# Patient Record
Sex: Male | Born: 1975 | Hispanic: Yes | Smoking: Never smoker
Health system: Southern US, Community
[De-identification: ages and names within clinical notes are randomized; demographics above are authoritative.]

## PROBLEM LIST (undated history)

## (undated) DIAGNOSIS — N412 Abscess of prostate: Secondary | ICD-10-CM

## (undated) DIAGNOSIS — W3400XA Accidental discharge from unspecified firearms or gun, initial encounter: Secondary | ICD-10-CM

## (undated) HISTORY — PX: FOOT SURGERY: SHX648

## (undated) SURGICAL SUPPLY — 4 items
BAG DRN RND TRDRP ANRFLXCHMBR (UROLOGICAL SUPPLIES) ×1
CATH FOLEY 2WAY SLVR  5CC 16FR (CATHETERS)
GOWN STRL REUS W/TWL XL LVL3 (GOWN DISPOSABLE) ×1
SYR TOOMEY IRRIG 70ML (MISCELLANEOUS) ×1

---

## 2019-02-11 DIAGNOSIS — F1010 Alcohol abuse, uncomplicated: Secondary | ICD-10-CM | POA: Insufficient documentation

## 2019-02-11 DIAGNOSIS — Z23 Encounter for immunization: Secondary | ICD-10-CM | POA: Insufficient documentation

## 2019-02-11 DIAGNOSIS — S0180XA Unspecified open wound of other part of head, initial encounter: Secondary | ICD-10-CM | POA: Insufficient documentation

## 2019-02-11 DIAGNOSIS — Y999 Unspecified external cause status: Secondary | ICD-10-CM | POA: Insufficient documentation

## 2019-02-11 DIAGNOSIS — Y939 Activity, unspecified: Secondary | ICD-10-CM | POA: Insufficient documentation

## 2019-02-11 DIAGNOSIS — Y92009 Unspecified place in unspecified non-institutional (private) residence as the place of occurrence of the external cause: Secondary | ICD-10-CM | POA: Insufficient documentation

## 2019-02-11 DIAGNOSIS — T148XXA Other injury of unspecified body region, initial encounter: Secondary | ICD-10-CM

## 2019-02-11 MED ORDER — SODIUM CHLORIDE 0.9 % IV BOLUS (SEPSIS)
1000.0000 mL | Freq: Once | INTRAVENOUS | Status: AC
  Administered 2019-02-11: 05:00:00 1000 mL via INTRAVENOUS

## 2019-02-11 MED ORDER — TETANUS-DIPHTH-ACELL PERTUSSIS 5-2.5-18.5 LF-MCG/0.5 IM SUSP
0.5000 mL | Freq: Once | INTRAMUSCULAR | Status: AC
  Administered 2019-02-11: 0.5 mL via INTRAMUSCULAR

## 2019-02-11 MED ORDER — KETOROLAC TROMETHAMINE 60 MG/2ML IM SOLN
30.0000 mg | Freq: Once | INTRAMUSCULAR | Status: AC
  Administered 2019-02-11: 30 mg via INTRAMUSCULAR

## 2020-11-07 DIAGNOSIS — T7840XA Allergy, unspecified, initial encounter: Secondary | ICD-10-CM | POA: Insufficient documentation

## 2020-11-07 DIAGNOSIS — F10929 Alcohol use, unspecified with intoxication, unspecified: Secondary | ICD-10-CM

## 2020-11-07 DIAGNOSIS — F10129 Alcohol abuse with intoxication, unspecified: Secondary | ICD-10-CM | POA: Insufficient documentation

## 2020-11-07 DIAGNOSIS — Y906 Blood alcohol level of 120-199 mg/100 ml: Secondary | ICD-10-CM | POA: Insufficient documentation

## 2020-11-07 MED ORDER — METHYLPREDNISOLONE SODIUM SUCC 125 MG IJ SOLR
125.0000 mg | Freq: Once | INTRAMUSCULAR | Status: AC
  Administered 2020-11-07: 125 mg via INTRAVENOUS

## 2020-11-08 MED ORDER — DIPHENHYDRAMINE HCL 50 MG/ML IJ SOLN
50.0000 mg | Freq: Once | INTRAMUSCULAR | Status: AC
  Administered 2020-11-08: 50 mg via INTRAVENOUS

## 2020-11-08 MED ORDER — PREDNISONE 20 MG PO TABS: 40.0000 mg | ORAL_TABLET | Freq: Every day | ORAL | 0 refills | Status: AC

## 2021-03-02 DIAGNOSIS — S2231XA Fracture of one rib, right side, initial encounter for closed fracture: Secondary | ICD-10-CM | POA: Insufficient documentation

## 2021-03-02 DIAGNOSIS — S40012A Contusion of left shoulder, initial encounter: Secondary | ICD-10-CM | POA: Insufficient documentation

## 2021-03-02 DIAGNOSIS — W19XXXA Unspecified fall, initial encounter: Secondary | ICD-10-CM

## 2021-03-02 DIAGNOSIS — R202 Paresthesia of skin: Secondary | ICD-10-CM | POA: Insufficient documentation

## 2021-03-02 DIAGNOSIS — W14XXXA Fall from tree, initial encounter: Secondary | ICD-10-CM | POA: Insufficient documentation

## 2021-03-02 DIAGNOSIS — R1012 Left upper quadrant pain: Secondary | ICD-10-CM | POA: Insufficient documentation

## 2021-03-02 DIAGNOSIS — Z20822 Contact with and (suspected) exposure to covid-19: Secondary | ICD-10-CM | POA: Insufficient documentation

## 2021-03-02 DIAGNOSIS — Y908 Blood alcohol level of 240 mg/100 ml or more: Secondary | ICD-10-CM | POA: Insufficient documentation

## 2021-03-02 DIAGNOSIS — Y990 Civilian activity done for income or pay: Secondary | ICD-10-CM | POA: Insufficient documentation

## 2021-03-02 DIAGNOSIS — F10129 Alcohol abuse with intoxication, unspecified: Secondary | ICD-10-CM | POA: Insufficient documentation

## 2021-03-02 DIAGNOSIS — Y93H2 Activity, gardening and landscaping: Secondary | ICD-10-CM | POA: Insufficient documentation

## 2021-03-02 DIAGNOSIS — R109 Unspecified abdominal pain: Secondary | ICD-10-CM

## 2021-03-02 DIAGNOSIS — S27321A Contusion of lung, unilateral, initial encounter: Secondary | ICD-10-CM | POA: Insufficient documentation

## 2021-03-02 DIAGNOSIS — F10920 Alcohol use, unspecified with intoxication, uncomplicated: Secondary | ICD-10-CM

## 2021-03-03 MED ORDER — IOHEXOL 350 MG/ML SOLN
100.0000 mL | Freq: Once | INTRAVENOUS | Status: AC | PRN
  Administered 2021-03-03: 100 mL via INTRAVENOUS

## 2021-04-17 DIAGNOSIS — R1013 Epigastric pain: Secondary | ICD-10-CM | POA: Insufficient documentation

## 2021-04-17 DIAGNOSIS — T7840XA Allergy, unspecified, initial encounter: Secondary | ICD-10-CM | POA: Insufficient documentation

## 2021-04-17 MED ADMIN — Methylprednisolone Sod Succ For Inj 125 MG (Base Equiv): 125 mg | INTRAVENOUS | NDC 00009004725

## 2021-04-17 MED ADMIN — Famotidine in NaCl 0.9% IV Soln 20 MG/50ML: 20 mg | INTRAVENOUS | NDC 00338519741

## 2021-10-25 DIAGNOSIS — Y908 Blood alcohol level of 240 mg/100 ml or more: Secondary | ICD-10-CM | POA: Insufficient documentation

## 2021-10-25 DIAGNOSIS — R4182 Altered mental status, unspecified: Secondary | ICD-10-CM | POA: Insufficient documentation

## 2021-10-25 DIAGNOSIS — F1910 Other psychoactive substance abuse, uncomplicated: Secondary | ICD-10-CM | POA: Insufficient documentation

## 2021-10-25 DIAGNOSIS — F10921 Alcohol use, unspecified with intoxication delirium: Secondary | ICD-10-CM | POA: Insufficient documentation

## 2021-10-25 MED ORDER — LACTATED RINGERS IV BOLUS
1000.0000 mL | Freq: Once | INTRAVENOUS | Status: AC
  Administered 2021-10-25: 1000 mL via INTRAVENOUS

## 2022-02-20 DIAGNOSIS — H6691 Otitis media, unspecified, right ear: Secondary | ICD-10-CM | POA: Insufficient documentation

## 2022-02-20 DIAGNOSIS — H6690 Otitis media, unspecified, unspecified ear: Secondary | ICD-10-CM

## 2022-02-20 MED ORDER — AMOXICILLIN 500 MG PO CAPS: 500.0000 mg | ORAL_CAPSULE | Freq: Two times a day (BID) | ORAL | 0 refills | Status: AC

## 2022-02-25 DIAGNOSIS — J069 Acute upper respiratory infection, unspecified: Secondary | ICD-10-CM | POA: Insufficient documentation

## 2022-02-25 DIAGNOSIS — Z20822 Contact with and (suspected) exposure to covid-19: Secondary | ICD-10-CM | POA: Insufficient documentation

## 2022-02-25 DIAGNOSIS — R519 Headache, unspecified: Secondary | ICD-10-CM | POA: Insufficient documentation

## 2022-02-25 DIAGNOSIS — R7401 Elevation of levels of liver transaminase levels: Secondary | ICD-10-CM | POA: Insufficient documentation

## 2022-02-25 MED ORDER — ACETAMINOPHEN 325 MG PO TABS
650.0000 mg | ORAL_TABLET | Freq: Once | ORAL | Status: AC
  Administered 2022-02-25: 650 mg via ORAL

## 2022-02-26 MED ORDER — NAPROXEN 375 MG PO TABS: 375.0000 mg | ORAL_TABLET | Freq: Two times a day (BID) | ORAL | 0 refills | Status: DC

## 2022-02-26 MED ORDER — KETOROLAC TROMETHAMINE 15 MG/ML IJ SOLN
15.0000 mg | Freq: Once | INTRAMUSCULAR | Status: AC
  Administered 2022-02-26: 15 mg via INTRAMUSCULAR

## 2022-02-26 MED ORDER — CEPACOL 15-2.3 MG MT LOZG: 1.0000 | LOZENGE | Freq: Four times a day (QID) | OROMUCOSAL | 0 refills | Status: DC

## 2022-03-13 DIAGNOSIS — Z9079 Acquired absence of other genital organ(s): Secondary | ICD-10-CM

## 2022-03-13 DIAGNOSIS — N412 Abscess of prostate: Principal | ICD-10-CM | POA: Diagnosis present

## 2022-03-13 HISTORY — DX: Accidental discharge from unspecified firearms or gun, initial encounter: W34.00XA

## 2022-03-13 MED ORDER — IOHEXOL 350 MG/ML SOLN
75.0000 mL | Freq: Once | INTRAVENOUS | Status: AC | PRN
  Administered 2022-03-13: 75 mL via INTRAVENOUS

## 2022-03-13 MED ORDER — ACETAMINOPHEN 325 MG PO TABS
650.0000 mg | ORAL_TABLET | Freq: Once | ORAL | Status: AC
  Administered 2022-03-13: 650 mg via ORAL

## 2022-03-14 DIAGNOSIS — N412 Abscess of prostate: Secondary | ICD-10-CM | POA: Diagnosis present

## 2022-03-14 HISTORY — PX: TRANSURETHRAL RESECTION OF PROSTATE: SHX73

## 2022-03-14 HISTORY — PX: CYSTOSCOPY: SHX5120

## 2022-03-14 MED ORDER — AMISULPRIDE (ANTIEMETIC) 5 MG/2ML IV SOLN: 10.0000 mg | Freq: Once | INTRAVENOUS | Status: DC | PRN

## 2022-03-14 MED ORDER — HYDROCODONE-ACETAMINOPHEN 5-325 MG PO TABS
1.0000 | ORAL_TABLET | ORAL | Status: DC | PRN
  Administered 2022-03-15 – 2022-03-17 (×7): 2 via ORAL

## 2022-03-14 MED ORDER — PROPOFOL 10 MG/ML IV BOLUS: INTRAVENOUS | Status: AC

## 2022-03-14 MED ORDER — ORAL CARE MOUTH RINSE: 15.0000 mL | Freq: Once | OROMUCOSAL | Status: AC

## 2022-03-14 MED ORDER — SODIUM CHLORIDE 0.9 % IV SOLN
2.0000 g | Freq: Every day | INTRAVENOUS | Status: DC
  Administered 2022-03-14: 2 g via INTRAVENOUS

## 2022-03-14 MED ORDER — ZOLPIDEM TARTRATE 5 MG PO TABS: 5.0000 mg | ORAL_TABLET | Freq: Every evening | ORAL | Status: DC | PRN

## 2022-03-14 MED ORDER — ONDANSETRON HCL 4 MG/2ML IJ SOLN: INTRAMUSCULAR | Status: AC

## 2022-03-14 MED ORDER — OXYBUTYNIN CHLORIDE 5 MG PO TABS
5.0000 mg | ORAL_TABLET | Freq: Three times a day (TID) | ORAL | Status: DC | PRN
  Administered 2022-03-15 – 2022-03-16 (×3): 5 mg via ORAL

## 2022-03-14 MED ORDER — DEXAMETHASONE SODIUM PHOSPHATE 10 MG/ML IJ SOLN: INTRAMUSCULAR | Status: AC

## 2022-03-14 MED ORDER — FENTANYL CITRATE (PF) 250 MCG/5ML IJ SOLN
INTRAMUSCULAR | Status: DC | PRN
  Administered 2022-03-14: 50 ug via INTRAVENOUS
  Administered 2022-03-14 (×2): 25 ug via INTRAVENOUS
  Administered 2022-03-14: 50 ug via INTRAVENOUS
  Administered 2022-03-14: 25 ug via INTRAVENOUS

## 2022-03-14 MED ORDER — PROPOFOL 10 MG/ML IV BOLUS
INTRAVENOUS | Status: DC | PRN
  Administered 2022-03-14: 140 mg via INTRAVENOUS

## 2022-03-14 MED ORDER — SODIUM CHLORIDE 0.9 % IR SOLN
3000.0000 mL | Status: DC
  Administered 2022-03-14 – 2022-03-15 (×4): 3000 mL

## 2022-03-14 MED ORDER — MIDAZOLAM HCL 2 MG/2ML IJ SOLN: INTRAMUSCULAR | Status: AC

## 2022-03-14 MED ORDER — ACETAMINOPHEN 325 MG PO TABS
650.0000 mg | ORAL_TABLET | ORAL | Status: DC | PRN
  Administered 2022-03-14 (×2): 650 mg via ORAL

## 2022-03-14 MED ORDER — FENTANYL CITRATE (PF) 100 MCG/2ML IJ SOLN
25.0000 ug | INTRAMUSCULAR | Status: DC | PRN
  Administered 2022-03-14 (×2): 25 ug via INTRAVENOUS
  Administered 2022-03-14: 50 ug via INTRAVENOUS

## 2022-03-14 MED ORDER — CHLORHEXIDINE GLUCONATE 0.12 % MT SOLN
15.0000 mL | Freq: Once | OROMUCOSAL | Status: AC
  Administered 2022-03-14: 15 mL via OROMUCOSAL

## 2022-03-14 MED ORDER — FENTANYL CITRATE (PF) 250 MCG/5ML IJ SOLN: INTRAMUSCULAR | Status: AC

## 2022-03-14 MED ORDER — ONDANSETRON HCL 4 MG/2ML IJ SOLN
INTRAMUSCULAR | Status: DC | PRN
  Administered 2022-03-14: 4 mg via INTRAVENOUS

## 2022-03-14 MED ORDER — SODIUM CHLORIDE 0.9 % IV SOLN: INTRAVENOUS | Status: DC

## 2022-03-14 MED ORDER — DEXAMETHASONE SODIUM PHOSPHATE 10 MG/ML IJ SOLN
INTRAMUSCULAR | Status: DC | PRN
  Administered 2022-03-14: 4 mg via INTRAVENOUS

## 2022-03-14 MED ORDER — FENTANYL CITRATE (PF) 100 MCG/2ML IJ SOLN: INTRAMUSCULAR | Status: AC

## 2022-03-14 MED ORDER — PHENYLEPHRINE 80 MCG/ML (10ML) SYRINGE FOR IV PUSH (FOR BLOOD PRESSURE SUPPORT)
PREFILLED_SYRINGE | INTRAVENOUS | Status: DC | PRN
  Administered 2022-03-14 (×6): 80 ug via INTRAVENOUS

## 2022-03-14 MED ORDER — HYDROMORPHONE HCL 1 MG/ML IJ SOLN: 0.5000 mg | INTRAMUSCULAR | Status: DC | PRN

## 2022-03-14 MED ORDER — LORAZEPAM 2 MG/ML IJ SOLN: 0.0000 mg | Freq: Four times a day (QID) | INTRAMUSCULAR | Status: DC

## 2022-03-14 MED ORDER — OXYCODONE HCL 5 MG PO TABS: 5.0000 mg | ORAL_TABLET | Freq: Once | ORAL | Status: DC | PRN

## 2022-03-14 MED ORDER — CHLORHEXIDINE GLUCONATE CLOTH 2 % EX PADS
6.0000 | MEDICATED_PAD | Freq: Every day | CUTANEOUS | Status: DC
  Administered 2022-03-14 – 2022-03-16 (×3): 6 via TOPICAL

## 2022-03-14 MED ORDER — LORAZEPAM 2 MG/ML IJ SOLN: 1.0000 mg | INTRAMUSCULAR | Status: DC | PRN

## 2022-03-14 MED ORDER — LORAZEPAM 2 MG/ML IJ SOLN: 0.0000 mg | Freq: Two times a day (BID) | INTRAMUSCULAR | Status: DC

## 2022-03-14 MED ORDER — ONDANSETRON HCL 4 MG/2ML IJ SOLN: 4.0000 mg | INTRAMUSCULAR | Status: DC | PRN

## 2022-03-14 MED ORDER — THIAMINE HCL 100 MG/ML IJ SOLN
100.0000 mg | Freq: Every day | INTRAMUSCULAR | Status: DC
  Administered 2022-03-14: 100 mg via INTRAVENOUS

## 2022-03-14 MED ORDER — SODIUM CHLORIDE 0.9 % IV SOLN
2.0000 g | INTRAVENOUS | Status: DC
  Administered 2022-03-15 – 2022-03-17 (×3): 2 g via INTRAVENOUS

## 2022-03-14 MED ORDER — LIDOCAINE 2% (20 MG/ML) 5 ML SYRINGE
INTRAMUSCULAR | Status: DC | PRN
  Administered 2022-03-14: 100 mg via INTRAVENOUS

## 2022-03-14 MED ORDER — OXYCODONE HCL 5 MG/5ML PO SOLN: 5.0000 mg | Freq: Once | ORAL | Status: DC | PRN

## 2022-03-14 MED ORDER — ACETAMINOPHEN 325 MG PO TABS
650.0000 mg | ORAL_TABLET | ORAL | Status: DC | PRN
  Administered 2022-03-14 – 2022-03-16 (×2): 650 mg via ORAL

## 2022-03-14 MED ORDER — SODIUM CHLORIDE 0.9 % IR SOLN
Status: DC | PRN
  Administered 2022-03-14: 6000 mL
  Administered 2022-03-14 (×2): 3000 mL
  Administered 2022-03-14: 6000 mL

## 2022-03-14 MED ORDER — LORAZEPAM 1 MG PO TABS: 1.0000 mg | ORAL_TABLET | ORAL | Status: DC | PRN

## 2022-03-14 MED ORDER — LACTATED RINGERS IV SOLN: INTRAVENOUS | Status: DC

## 2022-03-14 MED ORDER — STERILE WATER FOR IRRIGATION IR SOLN
Status: DC | PRN
  Administered 2022-03-14: 1000 mL

## 2022-03-14 MED ORDER — THIAMINE MONONITRATE 100 MG PO TABS: 100.0000 mg | ORAL_TABLET | Freq: Every day | ORAL | Status: DC

## 2022-03-14 MED ORDER — MIDAZOLAM HCL 2 MG/2ML IJ SOLN
INTRAMUSCULAR | Status: DC | PRN
  Administered 2022-03-14: 2 mg via INTRAVENOUS

## 2022-03-14 MED ORDER — SODIUM CHLORIDE 0.9 % IV SOLN
1.0000 g | Freq: Once | INTRAVENOUS | Status: AC
  Administered 2022-03-14: 1 g via INTRAVENOUS

## 2022-03-16 MED ORDER — DIPHENHYDRAMINE HCL 25 MG PO CAPS
25.0000 mg | ORAL_CAPSULE | Freq: Four times a day (QID) | ORAL | Status: DC | PRN
  Administered 2022-03-16: 25 mg via ORAL

## 2022-03-16 MED ORDER — FAMOTIDINE 20 MG PO TABS: 20.0000 mg | ORAL_TABLET | Freq: Every evening | ORAL | Status: DC | PRN

## 2022-03-17 MED ORDER — TRAMADOL HCL 50 MG PO TABS: 50.0000 mg | ORAL_TABLET | Freq: Four times a day (QID) | ORAL | 0 refills | Status: DC | PRN

## 2022-03-17 MED ORDER — CIPROFLOXACIN HCL 500 MG PO TABS: 500.0000 mg | ORAL_TABLET | Freq: Two times a day (BID) | ORAL | 0 refills | Status: AC

## 2022-03-23 DIAGNOSIS — X58XXXA Exposure to other specified factors, initial encounter: Secondary | ICD-10-CM | POA: Insufficient documentation

## 2022-03-23 DIAGNOSIS — S30812A Abrasion of penis, initial encounter: Secondary | ICD-10-CM | POA: Insufficient documentation

## 2022-03-23 DIAGNOSIS — R319 Hematuria, unspecified: Secondary | ICD-10-CM | POA: Insufficient documentation

## 2022-03-23 DIAGNOSIS — R590 Localized enlarged lymph nodes: Secondary | ICD-10-CM | POA: Insufficient documentation

## 2022-03-23 HISTORY — DX: Abscess of prostate: N41.2

## 2022-03-23 MED ORDER — LIDOCAINE HCL URETHRAL/MUCOSAL 2 % EX GEL
1.0000 | Freq: Once | CUTANEOUS | Status: AC
  Administered 2022-03-23: 1 via TOPICAL

## 2022-04-12 DIAGNOSIS — N390 Urinary tract infection, site not specified: Secondary | ICD-10-CM | POA: Insufficient documentation

## 2022-04-12 MED ORDER — ONDANSETRON HCL 4 MG/2ML IJ SOLN
4.0000 mg | Freq: Once | INTRAMUSCULAR | Status: AC
  Administered 2022-04-12: 4 mg via INTRAVENOUS

## 2022-04-12 MED ORDER — SODIUM CHLORIDE 0.9 % IV BOLUS
1000.0000 mL | Freq: Once | INTRAVENOUS | Status: AC
  Administered 2022-04-12: 1000 mL via INTRAVENOUS

## 2022-04-12 MED ORDER — SODIUM CHLORIDE 0.9 % IV SOLN
1.0000 g | Freq: Once | INTRAVENOUS | Status: AC
  Administered 2022-04-12: 1 g via INTRAVENOUS

## 2022-04-12 MED ORDER — CEPHALEXIN 500 MG PO CAPS: 500.0000 mg | ORAL_CAPSULE | Freq: Four times a day (QID) | ORAL | 0 refills | Status: DC

## 2022-04-12 MED ORDER — IOHEXOL 300 MG/ML  SOLN
100.0000 mL | Freq: Once | INTRAMUSCULAR | Status: AC | PRN
  Administered 2022-04-12: 100 mL via INTRAVENOUS

## 2022-04-16 DIAGNOSIS — L509 Urticaria, unspecified: Secondary | ICD-10-CM

## 2022-04-16 DIAGNOSIS — T7840XA Allergy, unspecified, initial encounter: Secondary | ICD-10-CM

## 2022-04-16 MED ORDER — METHYLPREDNISOLONE SODIUM SUCC 125 MG IJ SOLR
125.0000 mg | Freq: Once | INTRAMUSCULAR | Status: AC
  Administered 2022-04-16: 125 mg via INTRAVENOUS

## 2022-04-16 MED ORDER — EPINEPHRINE 0.3 MG/0.3ML IJ SOAJ
0.3000 mg | Freq: Once | INTRAMUSCULAR | Status: AC
  Administered 2022-04-16: 0.3 mg via INTRAMUSCULAR

## 2022-04-16 NOTE — ED Provider Notes (Incomplete)
[facility]-EMERGENCY DEPT Provider Note   CSN: [id number] Arrival date & time: 04/16/22  2308     History {Add pertinent medical, surgical, social history, OB history to HPI:1} Chief Complaint  Patient presents with  . Allergic Reaction    Hives    Steven Galloway is a 46 y.o. male.  Pt is a 46 yo male presenting from home via EMS for allergic reaction. Pt admits to acute onset sob, tongue swelling, difficulty swallowing, and hives that started approx 30 min pta. states symptoms started shortly after visiting the supermarket at 1030 pm tonight. Denies new food or medication intake. Denies allergies to food/mediations. Denies prior hx of allergic reaction like this. Pt states he is currently taking medications for prostatitis but does not know the name.   Patient received oral benadryl in route by EMS.  Patient received epinephrine and solumedrol subcu by triage prior to my evaluation.   The history is provided by the patient. No language interpreter was used.  Allergic Reaction Presenting symptoms: rash        Home Medications Prior to Admission medications   Medication Sig Start Date End Date Taking? Authorizing Provider  Benzocaine-Menthol (CEPACOL) 15-2.3 MG LOZG Use as directed 1 lozenge in the mouth or throat every 6 (six) hours. 02/26/22   Kommor, Madison, MD  cephALEXin (KEFLEX) 500 MG capsule Take 1 capsule (500 mg total) by mouth 4 (four) times daily. 04/12/22   Wynetta Fines, MD  naproxen (NAPROSYN) 375 MG tablet Take 1 tablet (375 mg total) by mouth 2 (two) times daily. 02/26/22   Kommor, Madison, MD  traMADol (ULTRAM) 50 MG tablet Take 1 tablet (50 mg total) by mouth every 6 (six) hours as needed. 03/17/22 03/17/23  Belva Agee, MD      Allergies    Patient has no known allergies.    Review of Systems   Review of Systems  Constitutional:  Negative for chills and fever.  HENT:  Negative for ear pain and sore throat.   Eyes:   Negative for pain and visual disturbance.  Respiratory:  Positive for shortness of breath. Negative for cough.   Cardiovascular:  Negative for chest pain and palpitations.  Gastrointestinal:  Negative for abdominal pain and vomiting.  Genitourinary:  Negative for dysuria and hematuria.  Musculoskeletal:  Negative for arthralgias and back pain.  Skin:  Positive for rash. Negative for color change.  Neurological:  Negative for seizures and syncope.  All other systems reviewed and are negative.   Physical Exam Updated Vital Signs BP (!) 146/91 (BP Location: Left Arm)   Pulse 71   Temp 97.6 F (36.4 C) (Oral)   Resp 20   SpO2 100%  Physical Exam Vitals and nursing note reviewed.  Constitutional:      General: He is not in acute distress.    Appearance: He is well-developed.  HENT:     Head: Normocephalic and atraumatic.  Eyes:     Conjunctiva/sclera: Conjunctivae normal.  Cardiovascular:     Rate and Rhythm: Normal rate and regular rhythm.     Heart sounds: No murmur heard. Pulmonary:     Effort: Pulmonary effort is normal. Tachypnea present. No respiratory distress.     Breath sounds: Normal breath sounds.  Abdominal:     Palpations: Abdomen is soft.     Tenderness: There is no abdominal tenderness.  Musculoskeletal:        General: No swelling.     Cervical back: Neck  supple.  Skin:    General: Skin is warm and dry.     Capillary Refill: Capillary refill takes less than 2 seconds.  Neurological:     Mental Status: He is alert.  Psychiatric:        Mood and Affect: Mood normal.     ED Results / Procedures / Treatments   Labs (all labs ordered are listed, but only abnormal results are displayed) Labs Reviewed  CBC  BASIC METABOLIC PANEL    EKG None  Radiology No results found.  Procedures Procedures  {Document cardiac monitor, telemetry assessment procedure when appropriate:1}  Medications Ordered in ED Medications  EPINEPHrine (EPI-PEN) injection 0.3  mg (0.3 mg Intramuscular Given 04/16/22 2341)  methylPREDNISolone sodium succinate (SOLU-MEDROL) 125 mg/2 mL injection 125 mg (125 mg Intravenous Given 04/16/22 2341)    ED Course/ Medical Decision Making/ A&P                           Medical Decision Making  ***  {Document critical care time when appropriate:1} {Document review of labs and clinical decision tools ie heart score, Chads2Vasc2 etc:1}  {Document your independent review of radiology images, and any outside records:1} {Document your discussion with family members, caretakers, and with consultants:1} {Document social determinants of health affecting pt's care:1} {Document your decision making why or why not admission, treatments were needed:1} Final Clinical Impression(s) / ED Diagnoses Final diagnoses:  None    Rx / DC Orders ED Discharge Orders     None

## 2022-04-17 MED ORDER — SODIUM CHLORIDE 0.9 % IV BOLUS
1000.0000 mL | Freq: Once | INTRAVENOUS | Status: AC
  Administered 2022-04-17: 1000 mL via INTRAVENOUS

## 2022-04-17 MED ORDER — DIPHENHYDRAMINE HCL 50 MG/ML IJ SOLN
25.0000 mg | Freq: Once | INTRAMUSCULAR | Status: AC
  Administered 2022-04-17: 25 mg via INTRAVENOUS

## 2022-04-17 MED ORDER — EPINEPHRINE 0.3 MG/0.3ML IJ SOAJ: 0.3000 mg | INTRAMUSCULAR | 0 refills | Status: AC | PRN

## 2022-04-17 MED ORDER — PREDNISONE 20 MG PO TABS: 20.0000 mg | ORAL_TABLET | Freq: Every day | ORAL | 0 refills | Status: AC

## 2022-04-17 MED ORDER — DIPHENHYDRAMINE HCL 25 MG PO TABS: 25.0000 mg | ORAL_TABLET | Freq: Four times a day (QID) | ORAL | 0 refills | Status: DC

## 2022-06-06 DIAGNOSIS — T7840XA Allergy, unspecified, initial encounter: Secondary | ICD-10-CM | POA: Insufficient documentation

## 2022-06-06 DIAGNOSIS — B029 Zoster without complications: Secondary | ICD-10-CM | POA: Insufficient documentation

## 2022-06-06 MED ORDER — HYDROCODONE-ACETAMINOPHEN 5-325 MG PO TABS: 1.0000 | ORAL_TABLET | ORAL | 0 refills | Status: DC | PRN

## 2022-06-06 MED ORDER — HYDROCORTISONE 1 % EX CREA: TOPICAL_CREAM | CUTANEOUS | 0 refills | Status: DC

## 2022-06-06 MED ORDER — VALACYCLOVIR HCL 1 G PO TABS: 1000.0000 mg | ORAL_TABLET | Freq: Three times a day (TID) | ORAL | 0 refills | Status: AC

## 2022-06-17 DIAGNOSIS — L299 Pruritus, unspecified: Secondary | ICD-10-CM | POA: Insufficient documentation

## 2022-06-17 DIAGNOSIS — L259 Unspecified contact dermatitis, unspecified cause: Secondary | ICD-10-CM | POA: Insufficient documentation

## 2022-06-17 MED ORDER — IBUPROFEN 800 MG PO TABS
800.0000 mg | ORAL_TABLET | Freq: Once | ORAL | Status: AC
  Administered 2022-06-17: 800 mg via ORAL

## 2022-06-17 MED ORDER — PREDNISONE 20 MG PO TABS: 40.0000 mg | ORAL_TABLET | Freq: Every day | ORAL | 0 refills | Status: AC

## 2022-06-17 MED ORDER — FAMOTIDINE 20 MG PO TABS: 20.0000 mg | ORAL_TABLET | Freq: Two times a day (BID) | ORAL | 0 refills | Status: DC

## 2022-06-17 MED ORDER — PREDNISONE 20 MG PO TABS
60.0000 mg | ORAL_TABLET | Freq: Once | ORAL | Status: AC
  Administered 2022-06-17: 60 mg via ORAL

## 2022-06-17 MED ORDER — ACETAMINOPHEN 500 MG PO TABS
1000.0000 mg | ORAL_TABLET | Freq: Once | ORAL | Status: AC
  Administered 2022-06-17: 1000 mg via ORAL

## 2022-06-17 MED ORDER — FAMOTIDINE 20 MG PO TABS
40.0000 mg | ORAL_TABLET | Freq: Once | ORAL | Status: AC
  Administered 2022-06-17: 40 mg via ORAL

## 2022-07-08 DIAGNOSIS — Z2914 Encounter for prophylactic rabies immune globin: Secondary | ICD-10-CM | POA: Insufficient documentation

## 2022-07-08 DIAGNOSIS — S71132A Puncture wound without foreign body, left thigh, initial encounter: Secondary | ICD-10-CM | POA: Insufficient documentation

## 2022-07-08 DIAGNOSIS — Z23 Encounter for immunization: Secondary | ICD-10-CM | POA: Insufficient documentation

## 2022-07-08 DIAGNOSIS — M79631 Pain in right forearm: Secondary | ICD-10-CM | POA: Insufficient documentation

## 2022-07-08 DIAGNOSIS — T148XXA Other injury of unspecified body region, initial encounter: Secondary | ICD-10-CM

## 2022-07-08 DIAGNOSIS — M79641 Pain in right hand: Secondary | ICD-10-CM | POA: Insufficient documentation

## 2022-07-08 DIAGNOSIS — W540XXA Bitten by dog, initial encounter: Secondary | ICD-10-CM | POA: Insufficient documentation

## 2022-07-08 MED ORDER — AMOXICILLIN-POT CLAVULANATE 875-125 MG PO TABS: 1.0000 | ORAL_TABLET | Freq: Two times a day (BID) | ORAL | 0 refills | Status: DC

## 2022-07-08 MED ORDER — AMOXICILLIN-POT CLAVULANATE 875-125 MG PO TABS
1.0000 | ORAL_TABLET | Freq: Once | ORAL | Status: AC
  Administered 2022-07-08: 1 via ORAL

## 2022-07-08 MED ORDER — RABIES IMMUNE GLOBULIN 150 UNIT/ML IM INJ
1400.0000 [IU] | INJECTION | Freq: Once | INTRAMUSCULAR | Status: AC
  Administered 2022-07-08: 1425 [IU] via INTRAMUSCULAR

## 2022-07-08 MED ORDER — RABIES VACCINE, PCEC IM SUSR
1.0000 mL | Freq: Once | INTRAMUSCULAR | Status: AC
  Administered 2022-07-08: 1 mL via INTRAMUSCULAR

## 2022-07-08 MED ORDER — TETANUS-DIPHTH-ACELL PERTUSSIS 5-2.5-18.5 LF-MCG/0.5 IM SUSY
0.5000 mL | PREFILLED_SYRINGE | Freq: Once | INTRAMUSCULAR | Status: AC
  Administered 2022-07-08: 0.5 mL via INTRAMUSCULAR

## 2022-10-06 DIAGNOSIS — Z6820 Body mass index (BMI) 20.0-20.9, adult: Secondary | ICD-10-CM | POA: Diagnosis not present

## 2022-10-06 DIAGNOSIS — J9602 Acute respiratory failure with hypercapnia: Secondary | ICD-10-CM | POA: Diagnosis present

## 2022-10-06 DIAGNOSIS — S022XXA Fracture of nasal bones, initial encounter for closed fracture: Secondary | ICD-10-CM | POA: Diagnosis present

## 2022-10-06 DIAGNOSIS — E162 Hypoglycemia, unspecified: Secondary | ICD-10-CM | POA: Diagnosis present

## 2022-10-06 DIAGNOSIS — Z9079 Acquired absence of other genital organ(s): Secondary | ICD-10-CM | POA: Diagnosis not present

## 2022-10-06 DIAGNOSIS — R4182 Altered mental status, unspecified: Secondary | ICD-10-CM

## 2022-10-06 DIAGNOSIS — T50904A Poisoning by unspecified drugs, medicaments and biological substances, undetermined, initial encounter: Secondary | ICD-10-CM

## 2022-10-06 DIAGNOSIS — E876 Hypokalemia: Secondary | ICD-10-CM | POA: Diagnosis not present

## 2022-10-06 DIAGNOSIS — R0902 Hypoxemia: Secondary | ICD-10-CM

## 2022-10-06 DIAGNOSIS — T402X1A Poisoning by other opioids, accidental (unintentional), initial encounter: Secondary | ICD-10-CM | POA: Diagnosis present

## 2022-10-06 DIAGNOSIS — J9601 Acute respiratory failure with hypoxia: Secondary | ICD-10-CM | POA: Diagnosis present

## 2022-10-06 DIAGNOSIS — D649 Anemia, unspecified: Secondary | ICD-10-CM | POA: Diagnosis present

## 2022-10-06 DIAGNOSIS — S0083XA Contusion of other part of head, initial encounter: Secondary | ICD-10-CM | POA: Diagnosis present

## 2022-10-06 DIAGNOSIS — T40604A Poisoning by unspecified narcotics, undetermined, initial encounter: Secondary | ICD-10-CM

## 2022-10-06 DIAGNOSIS — E8729 Other acidosis: Secondary | ICD-10-CM | POA: Diagnosis present

## 2022-10-06 DIAGNOSIS — Z79899 Other long term (current) drug therapy: Secondary | ICD-10-CM | POA: Diagnosis not present

## 2022-10-06 DIAGNOSIS — E440 Moderate protein-calorie malnutrition: Secondary | ICD-10-CM | POA: Diagnosis not present

## 2022-10-06 DIAGNOSIS — R7401 Elevation of levels of liver transaminase levels: Secondary | ICD-10-CM | POA: Diagnosis present

## 2022-10-06 DIAGNOSIS — G928 Other toxic encephalopathy: Secondary | ICD-10-CM | POA: Diagnosis present

## 2022-10-06 DIAGNOSIS — W1830XA Fall on same level, unspecified, initial encounter: Secondary | ICD-10-CM | POA: Diagnosis present

## 2022-10-06 DIAGNOSIS — E43 Unspecified severe protein-calorie malnutrition: Secondary | ICD-10-CM | POA: Diagnosis present

## 2022-10-06 DIAGNOSIS — Z9911 Dependence on respirator [ventilator] status: Secondary | ICD-10-CM | POA: Diagnosis not present

## 2022-10-06 DIAGNOSIS — J9600 Acute respiratory failure, unspecified whether with hypoxia or hypercapnia: Secondary | ICD-10-CM

## 2022-10-06 DIAGNOSIS — G9340 Encephalopathy, unspecified: Secondary | ICD-10-CM | POA: Diagnosis present

## 2022-10-06 DIAGNOSIS — S0990XA Unspecified injury of head, initial encounter: Secondary | ICD-10-CM

## 2022-10-06 MED ORDER — SUCCINYLCHOLINE CHLORIDE 20 MG/ML IJ SOLN
INTRAMUSCULAR | Status: AC | PRN
  Administered 2022-10-06: 120 mg via INTRAVENOUS

## 2022-10-06 MED ORDER — FAMOTIDINE IN NACL 20-0.9 MG/50ML-% IV SOLN
20.0000 mg | Freq: Two times a day (BID) | INTRAVENOUS | Status: DC
  Administered 2022-10-06 – 2022-10-07 (×2): 20 mg via INTRAVENOUS

## 2022-10-06 MED ORDER — NALOXONE HCL 2 MG/2ML IJ SOSY
2.0000 mg | PREFILLED_SYRINGE | Freq: Once | INTRAMUSCULAR | Status: AC
  Administered 2022-10-06: 2 mg via INTRAVENOUS

## 2022-10-06 MED ORDER — PROPOFOL 1000 MG/100ML IV EMUL: INTRAVENOUS | Status: AC

## 2022-10-06 MED ORDER — NALOXONE HCL 4 MG/10ML IJ SOLN
0.2500 mg/h | INTRAVENOUS | Status: DC
  Administered 2022-10-06: 0.25 mg/h via INTRAVENOUS

## 2022-10-06 MED ORDER — MIDAZOLAM HCL 2 MG/2ML IJ SOLN
1.0000 mg | INTRAMUSCULAR | Status: DC | PRN
  Administered 2022-10-06 – 2022-10-07 (×9): 2 mg via INTRAVENOUS

## 2022-10-06 MED ORDER — PNEUMOCOCCAL 20-VAL CONJ VACC 0.5 ML IM SUSY: 0.5000 mL | PREFILLED_SYRINGE | INTRAMUSCULAR | Status: DC | PRN

## 2022-10-06 MED ORDER — ORAL CARE MOUTH RINSE
15.0000 mL | OROMUCOSAL | Status: DC
  Administered 2022-10-06 – 2022-10-07 (×8): 15 mL via OROMUCOSAL

## 2022-10-06 MED ORDER — IOHEXOL 350 MG/ML SOLN
75.0000 mL | Freq: Once | INTRAVENOUS | Status: AC | PRN
  Administered 2022-10-06: 75 mL via INTRAVENOUS

## 2022-10-06 MED ORDER — MUPIROCIN 2 % EX OINT
1.0000 | TOPICAL_OINTMENT | Freq: Two times a day (BID) | CUTANEOUS | Status: DC
  Administered 2022-10-06 – 2022-10-09 (×6): 1 via NASAL

## 2022-10-06 MED ORDER — LACTATED RINGERS IV SOLN: INTRAVENOUS | Status: DC

## 2022-10-06 MED ORDER — NALOXONE HCL 2 MG/2ML IJ SOSY
0.8000 mg | PREFILLED_SYRINGE | Freq: Once | INTRAMUSCULAR | Status: AC
  Administered 2022-10-06: 0.8 mg via INTRAVENOUS

## 2022-10-06 MED ORDER — ORAL CARE MOUTH RINSE: 15.0000 mL | OROMUCOSAL | Status: DC | PRN

## 2022-10-06 MED ORDER — NALOXONE HCL 0.4 MG/ML IJ SOLN
0.4000 mg | Freq: Once | INTRAMUSCULAR | Status: AC
  Administered 2022-10-06: 0.4 mg via INTRAVENOUS

## 2022-10-06 MED ORDER — HEPARIN SODIUM (PORCINE) 5000 UNIT/ML IJ SOLN
5000.0000 [IU] | Freq: Three times a day (TID) | INTRAMUSCULAR | Status: DC
  Administered 2022-10-06 – 2022-10-08 (×8): 5000 [IU] via SUBCUTANEOUS

## 2022-10-06 MED ORDER — ONDANSETRON HCL 4 MG/2ML IJ SOLN
4.0000 mg | Freq: Once | INTRAMUSCULAR | Status: AC
  Administered 2022-10-06: 4 mg via INTRAVENOUS

## 2022-10-06 MED ORDER — DEXTROSE 50 % IV SOLN: INTRAVENOUS | Status: AC

## 2022-10-06 MED ORDER — KETAMINE HCL 10 MG/ML IJ SOLN
INTRAMUSCULAR | Status: AC | PRN
  Administered 2022-10-06: 150 mg via INTRAVENOUS

## 2022-10-06 MED ORDER — DOCUSATE SODIUM 50 MG/5ML PO LIQD: 100.0000 mg | Freq: Two times a day (BID) | ORAL | Status: DC | PRN

## 2022-10-06 MED ORDER — PROPOFOL 1000 MG/100ML IV EMUL
0.0000 ug/kg/min | INTRAVENOUS | Status: DC
  Administered 2022-10-06 (×2): 50 ug/kg/min via INTRAVENOUS
  Administered 2022-10-07: 35 ug/kg/min via INTRAVENOUS
  Administered 2022-10-07: 40 ug/kg/min via INTRAVENOUS

## 2022-10-06 MED ORDER — INSULIN ASPART 100 UNIT/ML IJ SOLN
0.0000 [IU] | INTRAMUSCULAR | Status: DC
  Administered 2022-10-06: 2 [IU] via SUBCUTANEOUS
  Administered 2022-10-07 – 2022-10-08 (×2): 1 [IU] via SUBCUTANEOUS

## 2022-10-06 MED ORDER — DEXTROSE 50 % IV SOLN
12.5000 g | INTRAVENOUS | Status: AC
  Administered 2022-10-06: 12.5 g via INTRAVENOUS

## 2022-10-06 MED ORDER — FAMOTIDINE 20 MG PO TABS: 20.0000 mg | ORAL_TABLET | Freq: Two times a day (BID) | ORAL | Status: DC

## 2022-10-06 MED ORDER — POLYETHYLENE GLYCOL 3350 17 G PO PACK: 17.0000 g | PACK | Freq: Every day | ORAL | Status: DC | PRN

## 2022-10-06 MED ORDER — DEXTROSE 50 % IV SOLN
25.0000 g | INTRAVENOUS | Status: AC
  Administered 2022-10-06: 25 g via INTRAVENOUS

## 2022-10-06 MED ORDER — CHLORHEXIDINE GLUCONATE CLOTH 2 % EX PADS
6.0000 | MEDICATED_PAD | Freq: Every day | CUTANEOUS | Status: DC
  Administered 2022-10-06 – 2022-10-08 (×3): 6 via TOPICAL

## 2022-10-06 MED ORDER — KETAMINE HCL 50 MG/5ML IJ SOSY: PREFILLED_SYRINGE | INTRAMUSCULAR | Status: AC

## 2022-10-07 DIAGNOSIS — E440 Moderate protein-calorie malnutrition: Secondary | ICD-10-CM

## 2022-10-07 DIAGNOSIS — J9601 Acute respiratory failure with hypoxia: Secondary | ICD-10-CM

## 2022-10-07 DIAGNOSIS — S022XXA Fracture of nasal bones, initial encounter for closed fracture: Secondary | ICD-10-CM

## 2022-10-07 DIAGNOSIS — J9602 Acute respiratory failure with hypercapnia: Secondary | ICD-10-CM

## 2022-10-07 DIAGNOSIS — Z9911 Dependence on respirator [ventilator] status: Secondary | ICD-10-CM

## 2022-10-07 DIAGNOSIS — G9340 Encephalopathy, unspecified: Secondary | ICD-10-CM

## 2022-10-07 MED ORDER — ADULT MULTIVITAMIN W/MINERALS CH
1.0000 | ORAL_TABLET | Freq: Every day | ORAL | Status: DC
  Administered 2022-10-07 – 2022-10-09 (×3): 1

## 2022-10-07 MED ORDER — ENSURE ENLIVE PO LIQD
237.0000 mL | Freq: Three times a day (TID) | ORAL | Status: DC
  Administered 2022-10-07 – 2022-10-08 (×3): 237 mL via ORAL

## 2022-10-07 MED ORDER — THIAMINE HCL 100 MG/ML IJ SOLN
100.0000 mg | Freq: Every day | INTRAMUSCULAR | Status: DC
  Administered 2022-10-07 – 2022-10-09 (×3): 100 mg via INTRAVENOUS

## 2022-10-07 MED ORDER — POTASSIUM CHLORIDE 20 MEQ PO PACK
40.0000 meq | PACK | Freq: Once | ORAL | Status: AC
  Administered 2022-10-07: 40 meq

## 2022-10-07 MED ORDER — SODIUM CHLORIDE 0.9 % IV SOLN: INTRAVENOUS | Status: DC | PRN

## 2022-10-07 MED ORDER — ACETAMINOPHEN 325 MG PO TABS
650.0000 mg | ORAL_TABLET | ORAL | Status: DC | PRN
  Administered 2022-10-07 – 2022-10-09 (×3): 650 mg via ORAL

## 2022-10-07 MED ORDER — FOLIC ACID 1 MG PO TABS
1.0000 mg | ORAL_TABLET | Freq: Every day | ORAL | Status: DC
  Administered 2022-10-07 – 2022-10-09 (×3): 1 mg

## 2022-10-07 MED ORDER — MAGNESIUM SULFATE 2 GM/50ML IV SOLN
2.0000 g | Freq: Once | INTRAVENOUS | Status: AC
  Administered 2022-10-07: 2 g via INTRAVENOUS

## 2022-10-07 MED ORDER — DEXTROSE 10 % IV SOLN: INTRAVENOUS | Status: DC

## 2022-10-07 MED ORDER — HALOPERIDOL LACTATE 5 MG/ML IJ SOLN
5.0000 mg | Freq: Four times a day (QID) | INTRAMUSCULAR | Status: DC | PRN
  Administered 2022-10-07: 5 mg via INTRAVENOUS

## 2022-10-07 MED ORDER — DEXMEDETOMIDINE HCL IN NACL 400 MCG/100ML IV SOLN
0.0000 ug/kg/h | INTRAVENOUS | Status: DC
  Administered 2022-10-07: 1.2 ug/kg/h via INTRAVENOUS
  Administered 2022-10-07: 0.4 ug/kg/h via INTRAVENOUS

## 2022-10-08 DIAGNOSIS — E43 Unspecified severe protein-calorie malnutrition: Secondary | ICD-10-CM | POA: Insufficient documentation

## 2023-02-25 DIAGNOSIS — B349 Viral infection, unspecified: Secondary | ICD-10-CM | POA: Insufficient documentation

## 2023-02-25 DIAGNOSIS — W57XXXA Bitten or stung by nonvenomous insect and other nonvenomous arthropods, initial encounter: Secondary | ICD-10-CM | POA: Insufficient documentation

## 2023-02-25 DIAGNOSIS — S0006XA Insect bite (nonvenomous) of scalp, initial encounter: Secondary | ICD-10-CM | POA: Insufficient documentation

## 2023-02-25 MED ORDER — KETOROLAC TROMETHAMINE 15 MG/ML IJ SOLN
15.0000 mg | Freq: Once | INTRAMUSCULAR | Status: AC
  Administered 2023-02-25: 15 mg via INTRAMUSCULAR

## 2023-02-25 MED ORDER — ONDANSETRON 4 MG PO TBDP: ORAL_TABLET | ORAL | 0 refills | Status: DC

## 2023-02-25 MED ORDER — BENZONATATE 100 MG PO CAPS: 100.0000 mg | ORAL_CAPSULE | Freq: Three times a day (TID) | ORAL | 0 refills | Status: DC

## 2023-03-27 DIAGNOSIS — R1031 Right lower quadrant pain: Secondary | ICD-10-CM | POA: Insufficient documentation

## 2023-03-27 MED ORDER — PANTOPRAZOLE SODIUM 40 MG IV SOLR
40.0000 mg | Freq: Once | INTRAVENOUS | Status: AC
  Administered 2023-03-27: 40 mg via INTRAVENOUS

## 2023-03-27 MED ORDER — KETOROLAC TROMETHAMINE 15 MG/ML IJ SOLN
15.0000 mg | Freq: Once | INTRAMUSCULAR | Status: AC
  Administered 2023-03-27: 15 mg via INTRAVENOUS

## 2023-03-27 MED ORDER — IOHEXOL 350 MG/ML SOLN
75.0000 mL | Freq: Once | INTRAVENOUS | Status: AC | PRN
  Administered 2023-03-27: 75 mL via INTRAVENOUS

## 2023-03-27 MED ORDER — ONDANSETRON HCL 4 MG/2ML IJ SOLN
4.0000 mg | Freq: Once | INTRAMUSCULAR | Status: AC
  Administered 2023-03-27: 4 mg via INTRAVENOUS

## 2023-05-29 IMAGING — CT CT ANGIO HEAD-NECK (W OR W/O PERF)
1 of 11 series · 5 of 33 positions shown · IV contrast (OMNI 350)
Comparison: Head CT 02/11/2019.

CLINICAL DATA: 45-year-old male with neurologic deficit. Fall from
tree yesterday. Left leg numbness.

EXAM:
CT ANGIOGRAPHY HEAD AND NECK
TECHNIQUE: Multidetector CT imaging of the head and neck was performed using
the standard protocol during bolus administration of intravenous
contrast. Multiplanar CT image reconstructions and MIPs were
obtained to evaluate the vascular anatomy. Carotid stenosis
measurements (when applicable) are obtained utilizing NASCET
criteria, using the distal internal carotid diameter as the
denominator.
CONTRAST:  100mL OMNIPAQUE IOHEXOL 350 MG/ML SOLN

[Series 10: cta neck axial · axial · 0.39mm/px · z∈[-217,-1]mm · 5 of 328 slices shown]
[im 55/328  soft-tissue]
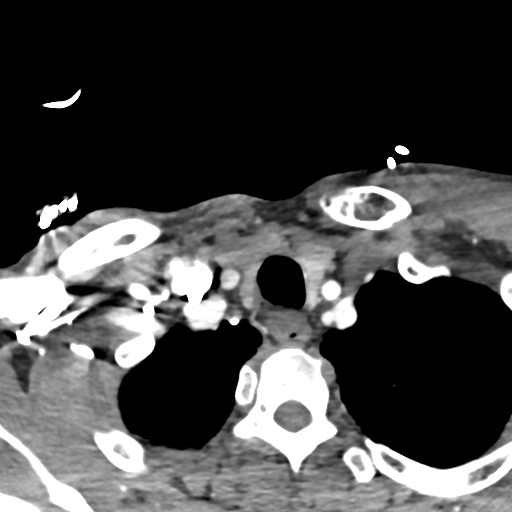
[im 110/328  bone]
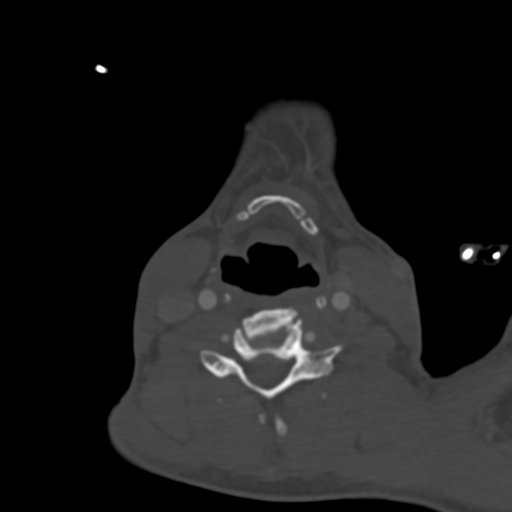
[im 164/328  soft-tissue]
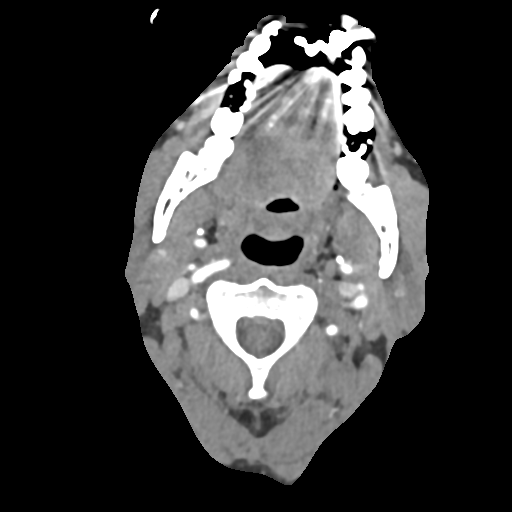
[im 219/328  bone]
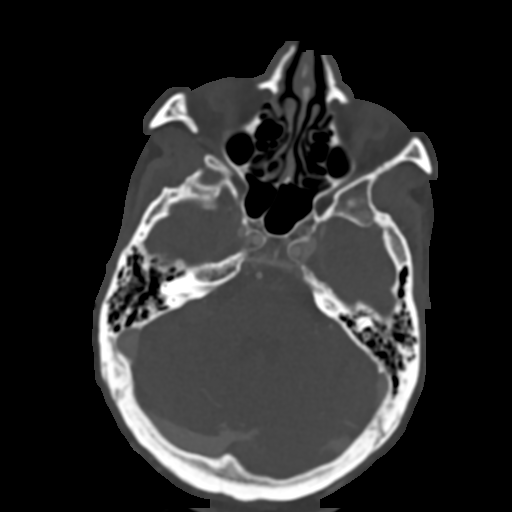
[im 273/328  soft-tissue]
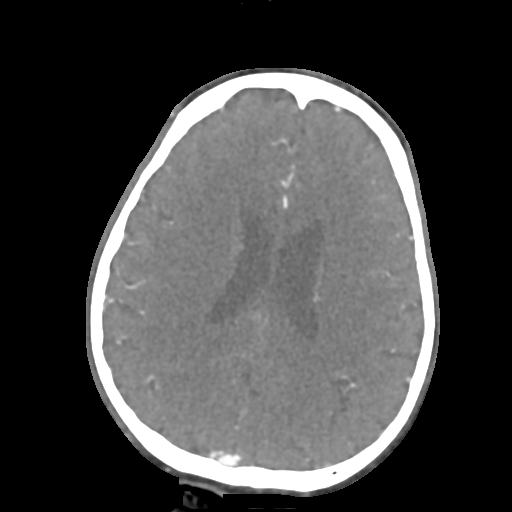

[5 of 33 positions shown; findings below may reference images not displayed]

FINDINGS: CT HEAD

Brain: Mild Calcified atherosclerosis at the skull base. Stable
cerebral volume. No midline shift, ventriculomegaly, mass effect,
evidence of mass lesion, intracranial hemorrhage or evidence of
cortically based acute infarction. Gray-white matter differentiation
is within normal limits throughout the brain.

Calvarium and skull base: No fracture identified.

Paranasal sinuses: Visualized paranasal sinuses and mastoids are
stable and well aerated.

Orbits: Leftward gaze. No orbit or scalp soft tissue injury
identified.

CTA NECK

Skeleton: Cervical spine CT detailed separately. Occasional dental
caries and periapical lucency. No acute osseous abnormality
identified.

Upper chest: Chest CT reported separately, but upper lungs appear
clear, negative superior mediastinum.

Other neck: No neck mass, lymphadenopathy, or soft tissue injury
identified. Small volume retained secretions in the hypopharynx.

Aortic arch: 3 vessel arch configuration.  No arch atherosclerosis.

Right carotid system: Negative aside from mild tortuosity of the
right ICA.

Left carotid system: Negative aside from mild tortuosity of the left
ICA.

Vertebral arteries:
Proximal right subclavian artery and right vertebral artery origin
are normal. Right V1 segment obscured by dense adjacent venous
contrast, but otherwise the right vertebral artery appears patent
and normal to the skull base. It is mildly non dominant.

Mild calcified plaque proximal left subclavian artery without
stenosis. Dominant left vertebral artery with normal origin, and is
patent, normal to the skull base.

CTA HEAD

Posterior circulation: Mildly dominant left V4 segment. Normal
distal vertebral arteries, PICA origins, vertebrobasilar junction.
Patent basilar artery without stenosis. Normal SCA and PCA origins.
Posterior communicating arteries are diminutive or absent. Bilateral
PCA branches are within normal limits.

Anterior circulation: Both ICA siphons are patent. Minor siphon
calcified plaque without stenosis. Normal ophthalmic artery origins.
Patent carotid termini, MCA and ACA origins. Dominant left and
diminutive right ACA A1 segments. Normal anterior communicating
artery. Bilateral ACA branches are within normal limits. Left MCA M1
segment and trifurcation are patent without stenosis. Right MCA M1
segment and bifurcation are patent without stenosis. Bilateral MCA
branches are within normal limits.

Venous sinuses: A portion of the superior sagittal sinus was not
included, but the visible major dural venous sinuses are enhancing
and appear patent.

Anatomic variants: Dominant left vertebral artery, left ACA A1.

Review of the MIP images confirms the above findings
IMPRESSION: 1. Negative for large vessel occlusion. Minimal atherosclerosis. No
arterial stenosis or injury in the head or neck.

2. Stable and normal noncontrast CT appearance of the brain.

3. No acute traumatic injury identified in the neck or upper chest.
CT Cervical Spine, Chest today are reported separately.

## 2023-05-29 IMAGING — CR DG CHEST 2V
2 series · 2 of 2 positions shown · non-contrast
Comparison: Chest CT March 03, 2021

CLINICAL DATA: Status post fall.

EXAM:
CHEST - 2 VIEW

[chest lat]
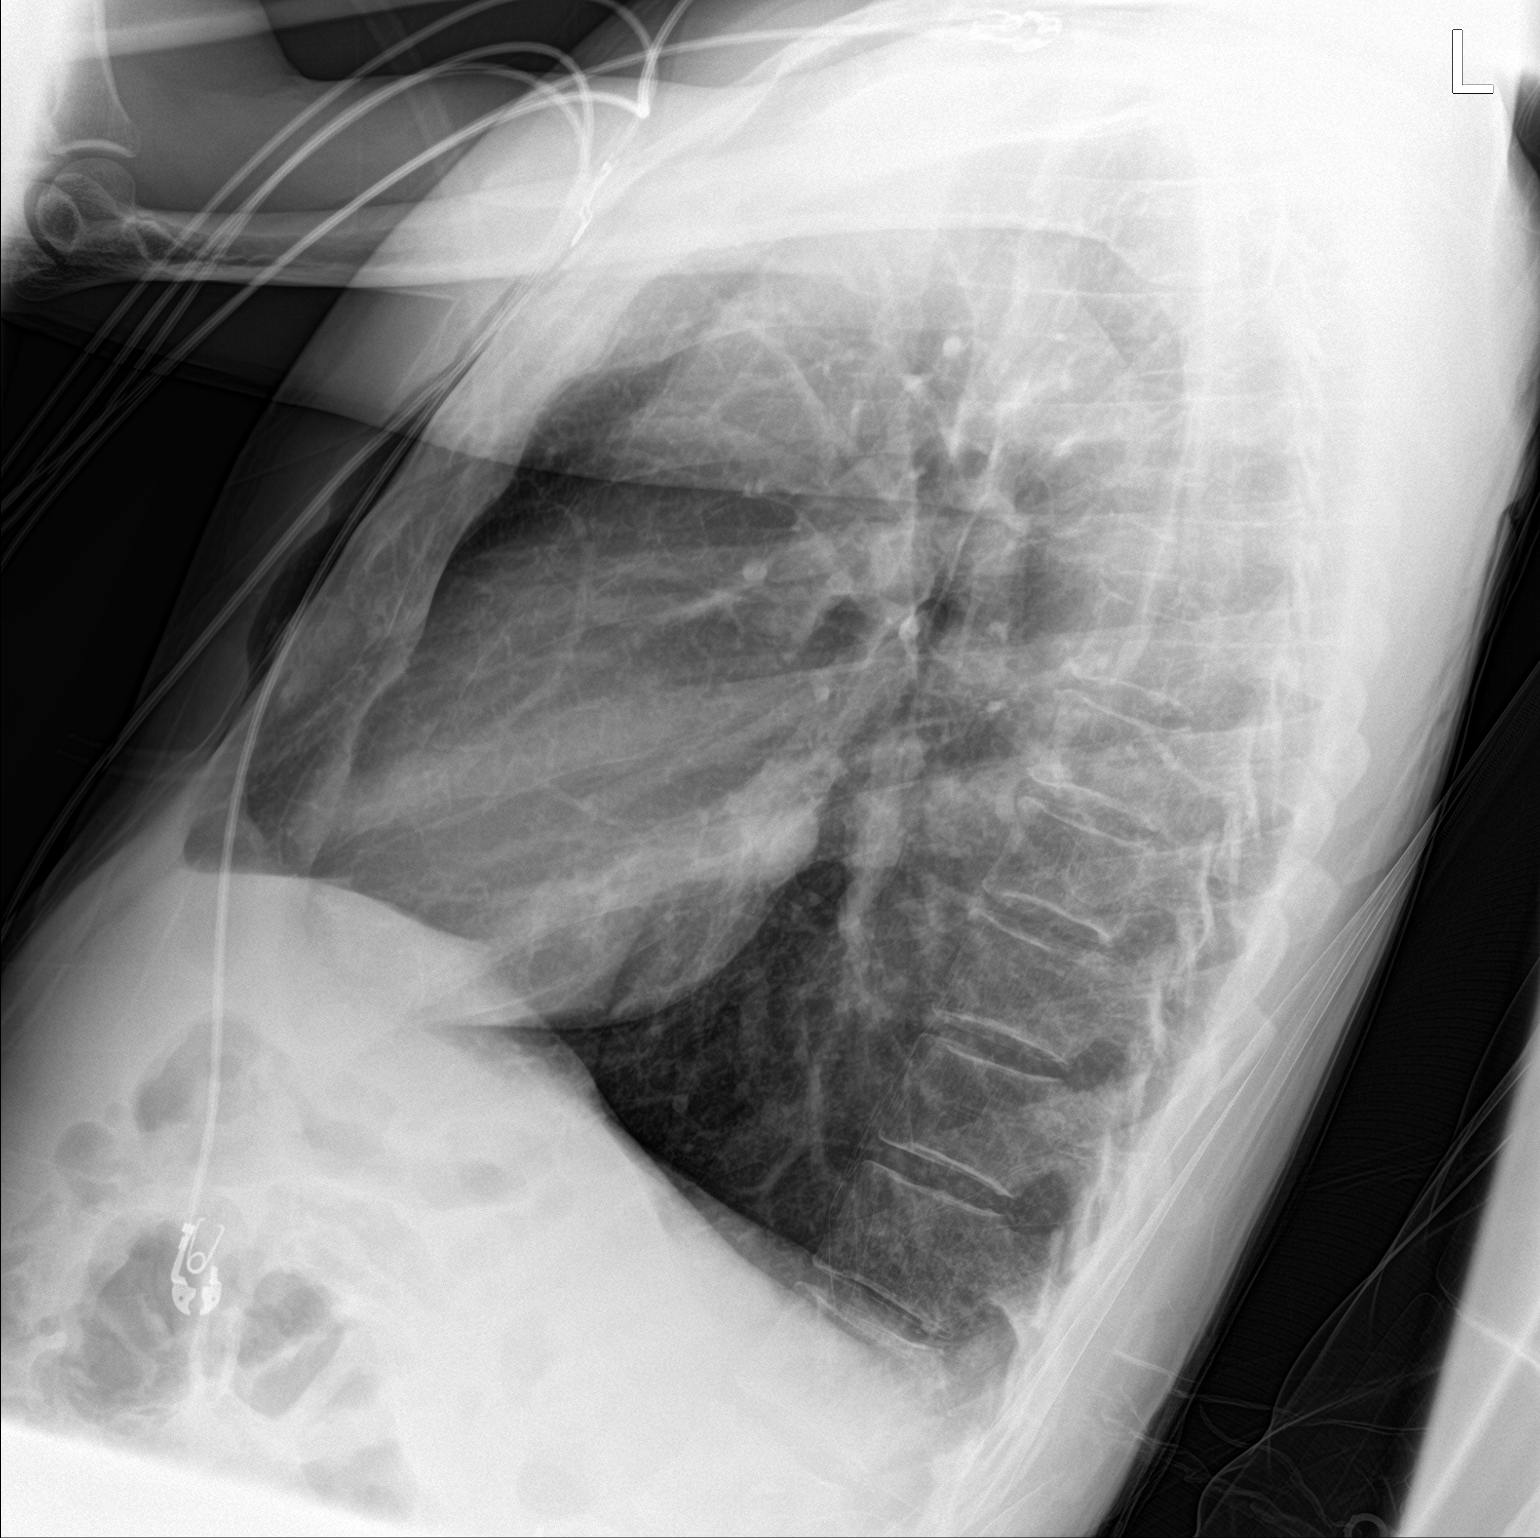

[chest ap]
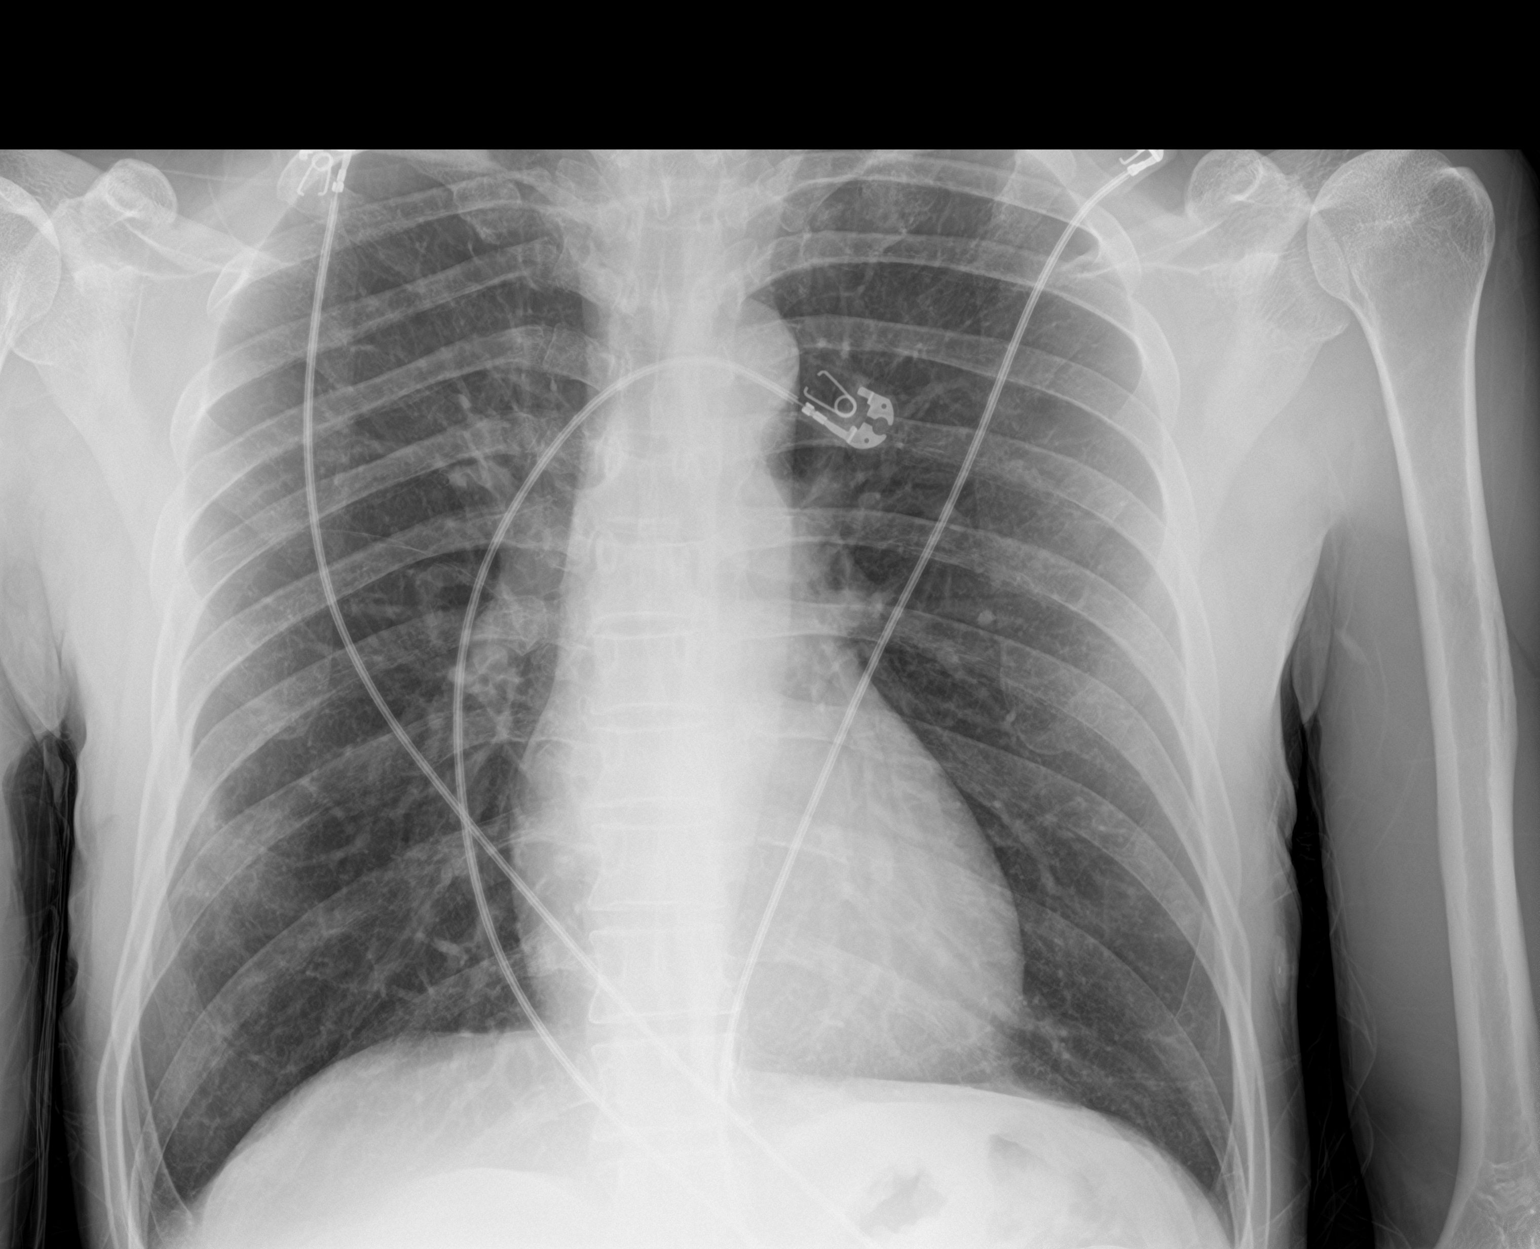

[2 of 2 positions shown; findings below may reference images not displayed]

FINDINGS: The heart size and mediastinal contours are within normal limits.
Both lungs are clear. The visualized skeletal structures are
unremarkable.
IMPRESSION: No active cardiopulmonary disease.

## 2023-07-29 DIAGNOSIS — T782XXA Anaphylactic shock, unspecified, initial encounter: Secondary | ICD-10-CM

## 2023-07-29 MED ORDER — FAMOTIDINE IN NACL 20-0.9 MG/50ML-% IV SOLN
20.0000 mg | Freq: Once | INTRAVENOUS | Status: AC
  Administered 2023-07-29: 20 mg via INTRAVENOUS

## 2023-07-29 MED ORDER — DEXAMETHASONE SODIUM PHOSPHATE 10 MG/ML IJ SOLN
10.0000 mg | Freq: Once | INTRAMUSCULAR | Status: AC
  Administered 2023-07-29: 10 mg via INTRAVENOUS

## 2023-07-29 MED ORDER — DIPHENHYDRAMINE HCL 25 MG PO CAPS
25.0000 mg | ORAL_CAPSULE | Freq: Once | ORAL | Status: AC
  Administered 2023-07-29: 25 mg via ORAL

## 2023-07-29 MED ORDER — EPINEPHRINE 0.3 MG/0.3ML IJ SOAJ: 0.3000 mg | INTRAMUSCULAR | 0 refills | Status: DC | PRN

## 2023-07-29 MED ORDER — DIPHENHYDRAMINE HCL 25 MG PO TABS: 25.0000 mg | ORAL_TABLET | Freq: Four times a day (QID) | ORAL | 0 refills | Status: DC | PRN

## 2023-08-24 DIAGNOSIS — Z23 Encounter for immunization: Secondary | ICD-10-CM | POA: Insufficient documentation

## 2023-08-24 DIAGNOSIS — S0083XA Contusion of other part of head, initial encounter: Secondary | ICD-10-CM

## 2023-08-24 DIAGNOSIS — F10920 Alcohol use, unspecified with intoxication, uncomplicated: Secondary | ICD-10-CM | POA: Insufficient documentation

## 2023-08-24 DIAGNOSIS — Y907 Blood alcohol level of 200-239 mg/100 ml: Secondary | ICD-10-CM | POA: Insufficient documentation

## 2023-08-24 DIAGNOSIS — S01111A Laceration without foreign body of right eyelid and periocular area, initial encounter: Secondary | ICD-10-CM | POA: Insufficient documentation

## 2023-08-24 DIAGNOSIS — S0181XA Laceration without foreign body of other part of head, initial encounter: Secondary | ICD-10-CM | POA: Insufficient documentation

## 2023-08-24 MED ORDER — FENTANYL CITRATE PF 50 MCG/ML IJ SOSY
PREFILLED_SYRINGE | INTRAMUSCULAR | Status: AC
  Administered 2023-08-24: 50 ug

## 2023-08-24 MED ORDER — IOHEXOL 350 MG/ML SOLN
75.0000 mL | Freq: Once | INTRAVENOUS | Status: AC | PRN
  Administered 2023-08-24: 75 mL via INTRAVENOUS

## 2023-08-24 MED ORDER — TETANUS-DIPHTH-ACELL PERTUSSIS 5-2.5-18.5 LF-MCG/0.5 IM SUSY
PREFILLED_SYRINGE | INTRAMUSCULAR | Status: AC
  Administered 2023-08-24: 0.5 mL via INTRAMUSCULAR

## 2023-08-25 MED ADMIN — Acetaminophen Tab 325 MG: 650 mg | ORAL | NDC 50580045811

## 2024-01-12 DIAGNOSIS — M7989 Other specified soft tissue disorders: Secondary | ICD-10-CM | POA: Insufficient documentation

## 2024-01-12 DIAGNOSIS — W19XXXA Unspecified fall, initial encounter: Secondary | ICD-10-CM

## 2024-01-12 DIAGNOSIS — T148XXA Other injury of unspecified body region, initial encounter: Secondary | ICD-10-CM

## 2024-01-12 DIAGNOSIS — Y990 Civilian activity done for income or pay: Secondary | ICD-10-CM | POA: Insufficient documentation

## 2024-01-12 DIAGNOSIS — S161XXA Strain of muscle, fascia and tendon at neck level, initial encounter: Secondary | ICD-10-CM | POA: Insufficient documentation

## 2024-01-12 DIAGNOSIS — S93492A Sprain of other ligament of left ankle, initial encounter: Secondary | ICD-10-CM

## 2024-01-12 DIAGNOSIS — S0990XA Unspecified injury of head, initial encounter: Secondary | ICD-10-CM | POA: Insufficient documentation

## 2024-01-12 DIAGNOSIS — S93432A Sprain of tibiofibular ligament of left ankle, initial encounter: Secondary | ICD-10-CM | POA: Insufficient documentation

## 2024-01-12 DIAGNOSIS — M549 Dorsalgia, unspecified: Secondary | ICD-10-CM | POA: Insufficient documentation

## 2024-01-12 DIAGNOSIS — W01198A Fall on same level from slipping, tripping and stumbling with subsequent striking against other object, initial encounter: Secondary | ICD-10-CM | POA: Insufficient documentation

## 2024-01-12 MED ORDER — OXYCODONE-ACETAMINOPHEN 5-325 MG PO TABS
1.0000 | ORAL_TABLET | ORAL | Status: DC | PRN
  Administered 2024-01-12: 1 via ORAL

## 2024-01-12 MED ADMIN — Ketorolac Tromethamine Inj 15 MG/ML: 15 mg | INTRAMUSCULAR | NDC 72266023401

## 2024-01-12 MED ADMIN — Fentanyl Citrate PF Soln Prefilled Syringe 50 MCG/ML: 50 ug | INTRAMUSCULAR | NDC 63323080801

## 2024-02-09 DIAGNOSIS — S0083XA Contusion of other part of head, initial encounter: Secondary | ICD-10-CM | POA: Insufficient documentation

## 2024-02-09 DIAGNOSIS — Y908 Blood alcohol level of 240 mg/100 ml or more: Secondary | ICD-10-CM | POA: Insufficient documentation

## 2024-02-09 DIAGNOSIS — S0031XA Abrasion of nose, initial encounter: Secondary | ICD-10-CM | POA: Insufficient documentation

## 2024-02-09 DIAGNOSIS — F10920 Alcohol use, unspecified with intoxication, uncomplicated: Secondary | ICD-10-CM | POA: Insufficient documentation

## 2024-02-09 DIAGNOSIS — S0081XA Abrasion of other part of head, initial encounter: Secondary | ICD-10-CM

## 2024-02-09 DIAGNOSIS — S00531A Contusion of lip, initial encounter: Secondary | ICD-10-CM | POA: Insufficient documentation

## 2024-02-09 MED ADMIN — Ketorolac Tromethamine Inj 15 MG/ML: 15 mg | INTRAVENOUS | NDC 72266023401

## 2024-02-10 MED ADMIN — Oxycodone w/ Acetaminophen Tab 5-325 MG: 1 | ORAL | NDC 00406051223

## 2024-03-10 DIAGNOSIS — M5441 Lumbago with sciatica, right side: Secondary | ICD-10-CM | POA: Insufficient documentation

## 2024-03-10 MED ORDER — METHYLPREDNISOLONE 4 MG PO TBPK: ORAL_TABLET | ORAL | 0 refills | Status: DC

## 2024-03-10 MED ORDER — IBUPROFEN 600 MG PO TABS: 600.0000 mg | ORAL_TABLET | Freq: Four times a day (QID) | ORAL | 0 refills | Status: AC | PRN

## 2024-03-10 MED ORDER — METHOCARBAMOL 500 MG PO TABS: 500.0000 mg | ORAL_TABLET | Freq: Two times a day (BID) | ORAL | 0 refills | Status: DC

## 2024-03-10 MED ADMIN — Lidocaine Patch 5%: 1 | TRANSDERMAL | NDC 65162079104

## 2024-03-10 MED ADMIN — Methocarbamol Tab 500 MG: 500 mg | ORAL | NDC 31722053301

## 2024-03-10 MED ADMIN — Ketorolac Tromethamine Inj 15 MG/ML: 15 mg | INTRAMUSCULAR | NDC 72266023401

## 2024-03-10 MED ADMIN — Prednisone Tab 20 MG: 60 mg | ORAL | NDC 60687014511

## 2024-04-19 DIAGNOSIS — F10129 Alcohol abuse with intoxication, unspecified: Secondary | ICD-10-CM | POA: Insufficient documentation

## 2024-04-19 DIAGNOSIS — S0990XA Unspecified injury of head, initial encounter: Secondary | ICD-10-CM | POA: Insufficient documentation

## 2024-04-19 DIAGNOSIS — W010XXA Fall on same level from slipping, tripping and stumbling without subsequent striking against object, initial encounter: Secondary | ICD-10-CM | POA: Insufficient documentation

## 2024-04-19 DIAGNOSIS — M25512 Pain in left shoulder: Secondary | ICD-10-CM | POA: Insufficient documentation

## 2024-04-19 DIAGNOSIS — F10920 Alcohol use, unspecified with intoxication, uncomplicated: Secondary | ICD-10-CM

## 2024-04-19 DIAGNOSIS — Y990 Civilian activity done for income or pay: Secondary | ICD-10-CM | POA: Insufficient documentation

## 2024-04-19 DIAGNOSIS — Y906 Blood alcohol level of 120-199 mg/100 ml: Secondary | ICD-10-CM | POA: Insufficient documentation

## 2024-04-19 DIAGNOSIS — Z79899 Other long term (current) drug therapy: Secondary | ICD-10-CM | POA: Insufficient documentation

## 2024-04-19 DIAGNOSIS — R7401 Elevation of levels of liver transaminase levels: Secondary | ICD-10-CM | POA: Insufficient documentation

## 2024-04-19 DIAGNOSIS — M542 Cervicalgia: Secondary | ICD-10-CM | POA: Insufficient documentation

## 2024-04-19 MED ORDER — METOCLOPRAMIDE HCL 10 MG PO TABS
10.0000 mg | ORAL_TABLET | Freq: Once | ORAL | Status: AC
  Administered 2024-04-19: 10 mg via ORAL

## 2024-04-19 MED ORDER — ACETAMINOPHEN 500 MG PO TABS
1000.0000 mg | ORAL_TABLET | ORAL | Status: AC
  Administered 2024-04-19: 1000 mg via ORAL

## 2024-04-26 DIAGNOSIS — K6289 Other specified diseases of anus and rectum: Secondary | ICD-10-CM | POA: Insufficient documentation

## 2024-04-26 MED ORDER — ONDANSETRON 4 MG PO TBDP
4.0000 mg | ORAL_TABLET | Freq: Once | ORAL | Status: AC
  Administered 2024-04-26: 4 mg via ORAL

## 2024-04-26 MED ORDER — IOHEXOL 350 MG/ML SOLN
75.0000 mL | Freq: Once | INTRAVENOUS | Status: AC | PRN
  Administered 2024-04-26: 75 mL via INTRAVENOUS

## 2024-04-27 MED ORDER — ONDANSETRON 4 MG PO TBDP
8.0000 mg | ORAL_TABLET | Freq: Once | ORAL | Status: AC
  Administered 2024-04-27: 8 mg via ORAL

## 2024-04-27 MED ORDER — DOCUSATE SODIUM 100 MG PO CAPS
100.0000 mg | ORAL_CAPSULE | Freq: Once | ORAL | Status: AC
  Administered 2024-04-27: 100 mg via ORAL

## 2024-04-27 MED ORDER — OXYCODONE-ACETAMINOPHEN 5-325 MG PO TABS: 1.0000 | ORAL_TABLET | Freq: Three times a day (TID) | ORAL | 0 refills | Status: AC | PRN

## 2024-04-27 MED ORDER — AMOXICILLIN-POT CLAVULANATE 875-125 MG PO TABS: 1.0000 | ORAL_TABLET | Freq: Two times a day (BID) | ORAL | 0 refills | Status: AC

## 2024-04-27 MED ORDER — DOCUSATE SODIUM 100 MG PO CAPS: 100.0000 mg | ORAL_CAPSULE | Freq: Two times a day (BID) | ORAL | 0 refills | Status: AC

## 2024-04-27 MED ORDER — ONDANSETRON 4 MG PO TBDP: ORAL_TABLET | ORAL | 0 refills | Status: AC

## 2024-04-27 MED ORDER — AMOXICILLIN-POT CLAVULANATE 875-125 MG PO TABS
1.0000 | ORAL_TABLET | Freq: Once | ORAL | Status: AC
  Administered 2024-04-27: 1 via ORAL

## 2024-04-27 MED ORDER — OXYCODONE-ACETAMINOPHEN 5-325 MG PO TABS
1.0000 | ORAL_TABLET | Freq: Once | ORAL | Status: AC
  Administered 2024-04-27: 1 via ORAL
